# Patient Record
Sex: Female | Born: 1955 | Race: White | Hispanic: No | Smoking: Never smoker
Health system: Southern US, Community
[De-identification: ages and names within clinical notes are randomized; demographics above are authoritative.]

## PROBLEM LIST (undated history)

## (undated) DIAGNOSIS — I2510 Atherosclerotic heart disease of native coronary artery without angina pectoris: Secondary | ICD-10-CM

## (undated) DIAGNOSIS — I4891 Unspecified atrial fibrillation: Secondary | ICD-10-CM

## (undated) DIAGNOSIS — I219 Acute myocardial infarction, unspecified: Secondary | ICD-10-CM

## (undated) DIAGNOSIS — C801 Malignant (primary) neoplasm, unspecified: Secondary | ICD-10-CM

## (undated) HISTORY — PX: JOINT REPLACEMENT: SHX530

## (undated) HISTORY — PX: ABDOMINAL HYSTERECTOMY: SHX81

## (undated) HISTORY — PX: BACK SURGERY: SHX140

---

## 2016-07-12 ENCOUNTER — Emergency Department (HOSPITAL_COMMUNITY)
Admission: EM | Admit: 2016-07-12 | Discharge: 2016-07-12 | Disposition: A | Discharge status: Home / Self Care | Attending: Emergency Medicine | Admitting: Emergency Medicine

## 2016-07-12 ENCOUNTER — Emergency Department (HOSPITAL_COMMUNITY)

## 2016-07-12 ENCOUNTER — Encounter (HOSPITAL_COMMUNITY): Payer: Self-pay | Admitting: Nurse Practitioner

## 2016-07-12 DIAGNOSIS — R599 Enlarged lymph nodes, unspecified: Secondary | ICD-10-CM

## 2016-07-12 DIAGNOSIS — R220 Localized swelling, mass and lump, head: Secondary | ICD-10-CM | POA: Diagnosis present

## 2016-07-12 DIAGNOSIS — I252 Old myocardial infarction: Secondary | ICD-10-CM | POA: Diagnosis not present

## 2016-07-12 DIAGNOSIS — Z859 Personal history of malignant neoplasm, unspecified: Secondary | ICD-10-CM | POA: Diagnosis not present

## 2016-07-12 DIAGNOSIS — Z96698 Presence of other orthopedic joint implants: Secondary | ICD-10-CM | POA: Insufficient documentation

## 2016-07-12 DIAGNOSIS — I2510 Atherosclerotic heart disease of native coronary artery without angina pectoris: Secondary | ICD-10-CM | POA: Insufficient documentation

## 2016-07-12 HISTORY — DX: Atherosclerotic heart disease of native coronary artery without angina pectoris: I25.10

## 2016-07-12 HISTORY — DX: Acute myocardial infarction, unspecified: I21.9

## 2016-07-12 HISTORY — DX: Malignant (primary) neoplasm, unspecified: C80.1

## 2016-07-12 HISTORY — DX: Unspecified atrial fibrillation: I48.91

## 2016-07-12 LAB — CBC WITH DIFFERENTIAL/PLATELET
Basophils Absolute: 0 10*3/uL (ref 0.0–0.1)
Basophils Relative: 0 %
Eosinophils Absolute: 0 10*3/uL (ref 0.0–0.7)
Eosinophils Relative: 0 %
HEMATOCRIT: 37.9 % (ref 36.0–46.0)
HEMOGLOBIN: 12.4 g/dL (ref 12.0–15.0)
Lymphocytes Relative: 25 %
Lymphs Abs: 1.7 10*3/uL (ref 0.7–4.0)
MCH: 29.2 pg (ref 26.0–34.0)
MCHC: 32.7 g/dL (ref 30.0–36.0)
MCV: 89.4 fL (ref 78.0–100.0)
MONOS PCT: 13 %
Monocytes Absolute: 0.9 10*3/uL (ref 0.1–1.0)
NEUTROS ABS: 4.2 10*3/uL (ref 1.7–7.7)
NEUTROS PCT: 62 %
Platelets: 287 10*3/uL (ref 150–400)
RBC: 4.24 MIL/uL (ref 3.87–5.11)
RDW: 13.8 % (ref 11.5–15.5)
WBC: 6.7 10*3/uL (ref 4.0–10.5)

## 2016-07-12 LAB — I-STAT CHEM 8, ED
BUN: 11 mg/dL (ref 6–20)
CREATININE: 1.2 mg/dL — AB (ref 0.44–1.00)
Calcium, Ion: 1.1 mmol/L — ABNORMAL LOW (ref 1.15–1.40)
Chloride: 102 mmol/L (ref 101–111)
Glucose, Bld: 103 mg/dL — ABNORMAL HIGH (ref 65–99)
HEMATOCRIT: 39 % (ref 36.0–46.0)
Hemoglobin: 13.3 g/dL (ref 12.0–15.0)
Potassium: 3.7 mmol/L (ref 3.5–5.1)
Sodium: 134 mmol/L — ABNORMAL LOW (ref 135–145)
TCO2: 19 mmol/L (ref 0–100)

## 2016-07-12 MED ORDER — PREDNISONE 20 MG PO TABS: 40.0000 mg | ORAL_TABLET | Freq: Every day | ORAL | 0 refills | Status: AC

## 2016-07-12 MED ORDER — DEXAMETHASONE SODIUM PHOSPHATE 10 MG/ML IJ SOLN
10.0000 mg | Freq: Once | INTRAMUSCULAR | Status: AC
  Administered 2016-07-12: 10 mg via INTRAVENOUS
  Filled 2016-07-12: qty 1

## 2016-07-12 MED ORDER — CLINDAMYCIN PHOSPHATE 900 MG/50ML IV SOLN
900.0000 mg | Freq: Once | INTRAVENOUS | Status: AC
  Administered 2016-07-12: 900 mg via INTRAVENOUS
  Filled 2016-07-12: qty 50

## 2016-07-12 MED ORDER — IOPAMIDOL (ISOVUE-300) INJECTION 61%
INTRAVENOUS | Status: AC
  Administered 2016-07-12: 75 mL
  Filled 2016-07-12: qty 75

## 2016-07-12 NOTE — ED Notes (Signed)
Patient transported to X-ray 

## 2016-07-12 NOTE — Discharge Instructions (Signed)
Please contact your Dentist to see if they have any recommendations for an oral surgeon as well.    Please also contact the providers below.    It is important that you be seen ASAP for evaluation of your swollen lymph nodes.  Your CT scan results are listed below.  Bring these with you when you get your appointment.  CLINICAL DATA:  Persistent LEFT jaw neck pain after root canal 3 days ago. Dysphagia. History of myocardial infarction. Assess for Ludwig's angina.  EXAM: CT NECK WITH CONTRAST  TECHNIQUE: Multidetector CT imaging of the neck was performed using the standard protocol following the bolus administration of intravenous contrast.  CONTRAST:  71mL ISOVUE-300 IOPAMIDOL (ISOVUE-300) INJECTION 61%  COMPARISON:  None.  FINDINGS: Pharynx and larynx: Normal.  Widely patent airway.  Salivary glands: Normal.  Thyroid: Normal.  Lymph nodes: Greater than expected number rounded LEFT level IIb, 3 and 4 lymph nodes measuring up to 9 mm. 10 mm short access RIGHT level IIa lymph node.  Vascular: Mild calcific atherosclerosis of the aortic arch and LEFT carotid bifurcation.  Limited intracranial: Normal.  Visualized orbits: Normal.  Mastoids and visualized paranasal sinuses: Well-aerated.  Skeleton: No acute osseous process. Moderate C5-6 and C6-7 degenerative discs resulting and moderate RIGHT C5-6 neural foraminal narrowing. Multiple dental fillings without dental caries or periapical lucency/abscess.  Upper chest: Lung apices are clear. No superior mediastinal lymphadenopathy.  Other:  None.  IMPRESSION: Greater than expected number of non pathologically enlarged predominantly LEFT neck lymph nodes with rounded, atypical morphology. Though these may be reactive, metastatic disease versus lymphoproliferative disorder not excluded. Recommend follow-up (short-term imaging follow-up, PET- CT or histopathologic sampling).  Otherwise negative CT neck, no imaged  findings of Ludwig's angina.

## 2016-07-12 NOTE — ED Triage Notes (Signed)
PT reports swelling to tongue swelling that started after she started taking antibiotics and a mouth wash called Dukes . Pt reports her neck,tongue and throat have swollen up. Pt reports she can hard eat.

## 2016-07-12 NOTE — ED Provider Notes (Signed)
[facility] DEPT Provider Note   CSN: [id number] Arrival date & time: 07/12/16  1541     History   Chief Complaint Chief Complaint  Patient presents with  . Mouth Lesions    HPI Stephanie Downs is a 61 y.o. female.  Patient presents to the emergency department with chief complaint of tongue swelling and recent dental abscess. She states that she was seen last week by her dentist, and was told that she needed root canal. She subsequently went to see an endodontist, who performed a root canal, but said that she had an infection underneath the tooth needed to see an oral Psychologist, sport and exercise. She has the appointment scheduled for tomorrow to see oral surgery, but states that yesterday she began to notice swelling along the side of her tongue and beneath her tongue. She states that this is painful, and makes it hard for her to swallow. She denies any shortness of breath. She states that she has had some chills, but denies any fever, nausea, or vomiting. There are no other associated symptoms. The symptoms are worsened with chewing, swallowing, and palpation.   The history is provided by the patient. No language interpreter was used.    Past Medical History:  Diagnosis Date  . Atrial fibrillation ([place])   . Cancer ([place])   . Coronary artery disease   . Myocardial infarction     There are no active problems to display for this patient.   Past Surgical History:  Procedure Laterality Date  . ABDOMINAL HYSTERECTOMY    . BACK SURGERY    . CESAREAN SECTION    . JOINT REPLACEMENT      OB History    No data available       Home Medications    Prior to Admission medications   Not on File    Family History History reviewed. No pertinent family history.  Social History Social History  Substance Use Topics  . Smoking status: Never Smoker  . Smokeless tobacco: Never Used  . Alcohol use No     Allergies   Adhesive [tape]; Aldomet [methyldopa]; Compazine [prochlorperazine  edisylate]; and Sulfa antibiotics   Review of Systems Review of Systems  All other systems reviewed and are negative.    Physical Exam Updated Vital Signs BP 115/63 (BP Location: Left Arm)   Pulse 68   Temp 98.2 F (36.8 C) (Oral)   Resp 18   SpO2 100%   Physical Exam Physical Exam  Constitutional: Pt appears well-developed and well-nourished.  HENT:  Head: Normocephalic.  Right Ear: Tympanic membrane, external ear and ear canal normal.  Left Ear: Tympanic membrane, external ear and ear canal normal.  Nose: Nose normal. Right sinus exhibits no maxillary sinus tenderness and no frontal sinus tenderness. Left sinus exhibits no maxillary sinus tenderness and no frontal sinus tenderness.  Mouth/Throat: Patient has tenderness to palpation of the sublingual space, with mild to moderate swelling, raising the tongue, the tongue is mildly swollen, there is also tenderness palpation along the submandibular and anterior neck, concerning for early Ludwig angina  Eyes: Conjunctivae are normal. Pupils are equal, round, and reactive to light. Right eye exhibits no discharge. Left eye exhibits no discharge.  Neck: Normal range of motion. Neck supple.  No stridor Handling secretions without difficulty No nuchal rigidity No cervical lymphadenopathy Cardiovascular: Normal rate, regular rhythm and normal heart sounds.   Pulmonary/Chest: Effort normal. No respiratory distress.  Equal chest rise  Abdominal: Soft. Bowel sounds are normal. Pt exhibits no distension.  There is no tenderness.  Lymphadenopathy: Pt has no cervical adenopathy.  Neurological: Pt is alert and oriented x 4  Skin: Skin is warm and dry.  Psychiatric: Pt has a normal mood and affect.  Nursing note and vitals reviewed.    ED Treatments / Results  Labs (all labs ordered are listed, but only abnormal results are displayed) Labs Reviewed  I-STAT CHEM 8, ED - Abnormal; Notable for the following:       Result Value    Sodium 134 (*)    Creatinine, Ser 1.20 (*)    Glucose, Bld 103 (*)    Calcium, Ion 1.10 (*)    All other components within normal limits  CBC WITH DIFFERENTIAL/PLATELET    EKG  EKG Interpretation None       Radiology Dg Chest 2 View  Result Date: 07/12/2016 CLINICAL DATA:  Tongue swelling following medication, initial encounter EXAM: CHEST  2 VIEW COMPARISON:  None. FINDINGS: Cardiac shadow is within normal limits. The lungs are well aerated bilaterally. Mild left lingular atelectasis is seen. No sizable effusion is noted. No bony abnormality is noted. IMPRESSION: Lingular atelectasis. Electronically Signed   By: Inez Catalina M.D.   On: 07/12/2016 19:51   Ct Soft Tissue Neck W Contrast  Result Date: 07/12/2016 CLINICAL DATA:  Persistent LEFT jaw neck pain after root canal 3 days ago. Dysphagia. History of myocardial infarction. Assess for Ludwig's angina. EXAM: CT NECK WITH CONTRAST TECHNIQUE: Multidetector CT imaging of the neck was performed using the standard protocol following the bolus administration of intravenous contrast. CONTRAST:  39mL ISOVUE-300 IOPAMIDOL (ISOVUE-300) INJECTION 61% COMPARISON:  None. FINDINGS: Pharynx and larynx: Normal.  Widely patent airway. Salivary glands: Normal. Thyroid: Normal. Lymph nodes: Greater than expected number rounded LEFT level IIb, 3 and 4 lymph nodes measuring up to 9 mm. 10 mm short access RIGHT level IIa lymph node. Vascular: Mild calcific atherosclerosis of the aortic arch and LEFT carotid bifurcation. Limited intracranial: Normal. Visualized orbits: Normal. Mastoids and visualized paranasal sinuses: Well-aerated. Skeleton: No acute osseous process. Moderate C5-6 and C6-7 degenerative discs resulting and moderate RIGHT C5-6 neural foraminal narrowing. Multiple dental fillings without dental caries or periapical lucency/abscess. Upper chest: Lung apices are clear. No superior mediastinal lymphadenopathy. Other:  None. IMPRESSION: Greater than  expected number of non pathologically enlarged predominantly LEFT neck lymph nodes with rounded, atypical morphology. Though these may be reactive, metastatic disease versus lymphoproliferative disorder not excluded. Recommend follow-up (short-term imaging follow-up, PET- CT or histopathologic sampling). Otherwise negative CT neck, no imaged findings of Ludwig's angina. Electronically Signed   By: Elon Alas M.D.   On: 07/12/2016 18:32    Procedures Procedures (including critical care time)  Medications Ordered in ED Medications  clindamycin (CLEOCIN) IVPB 900 mg (900 mg Intravenous New Bag/Given 07/12/16 1707)  dexamethasone (DECADRON) injection 10 mg (10 mg Intravenous Given 07/12/16 1706)     Initial Impression / Assessment and Plan / ED Course  I have reviewed the triage vital signs and the nursing notes.  Pertinent labs & imaging results that were available during my care of the patient were reviewed by me and considered in my medical decision making (see chart for details).    Patient with exam concerning for early [place] angina. Will give clindamycin, Decadron, and get CT of neck.  CT scan shows no evidence of Ludwig angina or deep space infection. There are several enlarged lymph nodes. Radiology recommends short-term follow-up with PET scan or biopsy. Patient's mother had lymphoma.  She is concerned about this. I will check chest x-ray in the ED, but plan for close outpatient follow-up with primary care and with oral surgery. I have asked that the patient discontinue her mouthwash, but continue the antibiotic and steroid medication. Return precautions given. Patient understands and agrees with the plan. She is stable and ready for discharge.  Patient seen by and discussed with Dr. Roderic Palau, who agrees with the plan.  Final Clinical Impressions(s) / ED Diagnoses   Final diagnoses:  Lymph nodes enlarged    New Prescriptions Discharge Medication List as of 07/12/2016  8:13 PM      START taking these medications   Details  predniSONE (DELTASONE) 20 MG tablet Take 2 tablets (40 mg total) by mouth daily., Starting Sun 07/12/2016, Print         Montine Circle, PA-C 07/12/16 2056    Milton Ferguson, MD 07/13/16 1226

## 2016-07-12 NOTE — ED Triage Notes (Signed)
Pt presents with c/o oral pain and swelling. She has had several dental procedures over the past several weeks and most recently was started on oral antibiotics, triamcinolone cream, and dukes mouthwash for an ulcer in her mouth. She was referred to oral surgeon for further evaluation but they cant see her until next week and her symptoms have gotten worse since starting the new medications.

## 2016-08-15 ENCOUNTER — Emergency Department (HOSPITAL_COMMUNITY)
Admission: EM | Admit: 2016-08-15 | Discharge: 2016-08-15 | Disposition: A | Discharge status: Home / Self Care | Attending: Emergency Medicine | Admitting: Emergency Medicine

## 2016-08-15 ENCOUNTER — Encounter (HOSPITAL_COMMUNITY): Payer: Self-pay | Admitting: Nurse Practitioner

## 2016-08-15 ENCOUNTER — Emergency Department (HOSPITAL_COMMUNITY)

## 2016-08-15 DIAGNOSIS — Z79899 Other long term (current) drug therapy: Secondary | ICD-10-CM | POA: Diagnosis not present

## 2016-08-15 DIAGNOSIS — I252 Old myocardial infarction: Secondary | ICD-10-CM | POA: Insufficient documentation

## 2016-08-15 DIAGNOSIS — I2510 Atherosclerotic heart disease of native coronary artery without angina pectoris: Secondary | ICD-10-CM | POA: Diagnosis not present

## 2016-08-15 DIAGNOSIS — K047 Periapical abscess without sinus: Secondary | ICD-10-CM | POA: Diagnosis not present

## 2016-08-15 DIAGNOSIS — K0889 Other specified disorders of teeth and supporting structures: Secondary | ICD-10-CM | POA: Diagnosis present

## 2016-08-15 LAB — BASIC METABOLIC PANEL
Anion gap: 9 (ref 5–15)
BUN: 11 mg/dL (ref 6–20)
CALCIUM: 9.4 mg/dL (ref 8.9–10.3)
CO2: 24 mmol/L (ref 22–32)
Chloride: 110 mmol/L (ref 101–111)
Creatinine, Ser: 1.2 mg/dL — ABNORMAL HIGH (ref 0.44–1.00)
GFR calc Af Amer: 55 mL/min — ABNORMAL LOW (ref >60)
GFR, EST NON AFRICAN AMERICAN: 48 mL/min — AB (ref >60)
GLUCOSE: 96 mg/dL (ref 65–99)
Potassium: 3.8 mmol/L (ref 3.5–5.1)
Sodium: 143 mmol/L (ref 135–145)

## 2016-08-15 LAB — CBC WITH DIFFERENTIAL/PLATELET
BASOS ABS: 0 10*3/uL (ref 0.0–0.1)
Basophils Relative: 0 %
EOS PCT: 1 %
Eosinophils Absolute: 0.1 10*3/uL (ref 0.0–0.7)
HCT: 39.2 % (ref 36.0–46.0)
Hemoglobin: 12.4 g/dL (ref 12.0–15.0)
LYMPHS ABS: 2 10*3/uL (ref 0.7–4.0)
LYMPHS PCT: 40 %
MCH: 29 pg (ref 26.0–34.0)
MCHC: 31.6 g/dL (ref 30.0–36.0)
MCV: 91.6 fL (ref 78.0–100.0)
MONO ABS: 0.5 10*3/uL (ref 0.1–1.0)
Monocytes Relative: 9 %
Neutro Abs: 2.4 10*3/uL (ref 1.7–7.7)
Neutrophils Relative %: 50 %
PLATELETS: 300 10*3/uL (ref 150–400)
RBC: 4.28 MIL/uL (ref 3.87–5.11)
RDW: 15.3 % (ref 11.5–15.5)
WBC: 4.9 10*3/uL (ref 4.0–10.5)

## 2016-08-15 LAB — I-STAT TROPONIN, ED: Troponin i, poc: 0 ng/mL (ref 0.00–0.08)

## 2016-08-15 MED ORDER — IOPAMIDOL (ISOVUE-300) INJECTION 61%
INTRAVENOUS | Status: AC
  Administered 2016-08-15: 75 mL
  Filled 2016-08-15: qty 75

## 2016-08-15 NOTE — ED Triage Notes (Signed)
Pt presents with c/o dental, neck pain. She has been seen here for this in February and since then has been to oral surgeon, endodontist, ENT, PCP, dentist and has taken multiple antibiotics with worsening of her symptoms. Now she has developed a cough and visible swelling to her neck, chest. She returned to ED today for further evaluation. She is currently on clindamycin

## 2016-08-15 NOTE — ED Provider Notes (Signed)
[facility] DEPT Provider Note   CSN: [id number] Arrival date & time: 08/15/16  1300     History   Chief Complaint Chief Complaint  Patient presents with  . Dental Problem    HPI Stephanie Downs is a 61 y.o. female p/w neck tightness/pressure. Pt recently underwent left mandible root canal 2/9 complicated by sublingual infection. Pt has followed-up with ENT and oral surgeon and been on multiple courses of different antibiotics over the past several weeks. She now has sensation of pressure and tightness in left neck and shoulder that feels like her "clavicle might pop."   The history is provided by the patient and medical records.  Illness  This is a new problem. Episode onset: about one week ago. The problem occurs constantly. The problem has been gradually worsening. Pertinent negatives include no chest pain, no abdominal pain, no headaches and no shortness of breath. Nothing aggravates the symptoms. She has tried rest for the symptoms.    Past Medical History:  Diagnosis Date  . Atrial fibrillation ([place])   . Cancer ([place])   . Coronary artery disease   . Myocardial infarction     There are no active problems to display for this patient.   Past Surgical History:  Procedure Laterality Date  . ABDOMINAL HYSTERECTOMY    . BACK SURGERY    . CESAREAN SECTION    . JOINT REPLACEMENT      OB History    No data available       Home Medications    Prior to Admission medications   Medication Sig Start Date End Date Taking? Authorizing Provider  amoxicillin (AMOXIL) 875 MG tablet Take 875 mg by mouth 2 (two) times daily. 7 day course filled 07/10/16 07/10/16   Historical Provider, MD  atorvastatin (LIPITOR) 40 MG tablet Take 40 mg by mouth at bedtime.    Historical Provider, MD  cholecalciferol (VITAMIN D) 1000 units tablet Take 1,000 Units by mouth daily.    Historical Provider, MD  diclofenac sodium (VOLTAREN) 1 % GEL Apply 2 g topically at bedtime. Knee pain     Historical Provider, MD  diltiazem (CARDIZEM CD) 180 MG 24 hr capsule Take 180 mg by mouth daily.    Historical Provider, MD  Diphenhyd-Hydrocort-Nystatin (FIRST-DUKES MOUTHWASH) SUSP Use as directed 5 mLs in the mouth or throat every 4 (four) hours as needed (mouth pain (swish and spit)).    Historical Provider, MD  donepezil (ARICEPT) 10 MG tablet Take 10 mg by mouth at bedtime.    Historical Provider, MD  fluticasone (FLONASE) 50 MCG/ACT nasal spray Place 1 spray into both nostrils daily as needed for allergies or rhinitis (congestion).    Historical Provider, MD  furosemide (LASIX) 20 MG tablet Take 20 mg by mouth every evening.    Historical Provider, MD  furosemide (LASIX) 40 MG tablet Take 40 mg by mouth daily.    Historical Provider, MD  gabapentin (NEURONTIN) 300 MG capsule Take 60 mg by mouth 2 (two) times daily.    Historical Provider, MD  hydrochlorothiazide (HYDRODIURIL) 25 MG tablet Take 25 mg by mouth daily.    Historical Provider, MD  isosorbide mononitrate (IMDUR) 60 MG 24 hr tablet Take 60 mg by mouth at bedtime.    Historical Provider, MD  ketotifen (ZADITOR) 0.025 % ophthalmic solution Place 1 drop into both eyes daily.    Historical Provider, MD  lisinopril (PRINIVIL,ZESTRIL) 40 MG tablet Take 40 mg by mouth daily.    Historical Provider, MD  memantine (NAMENDA) 10 MG tablet Take 10 mg by mouth 2 (two) times daily.    Historical Provider, MD  metoprolol succinate (TOPROL-XL) 50 MG 24 hr tablet Take 25 mg by mouth every evening. Take with or immediately following a meal.    Historical Provider, MD  omeprazole (PRILOSEC) 20 MG capsule Take 20 mg by mouth daily.    Historical Provider, MD  potassium chloride SA (K-DUR,KLOR-CON) 20 MEQ tablet Take 60 mEq by mouth daily.    Historical Provider, MD  predniSONE (DELTASONE) 20 MG tablet Take 2 tablets (40 mg total) by mouth daily. 07/12/16   Montine Circle, PA-C  rivaroxaban (XARELTO) 20 MG TABS tablet Take 20 mg by mouth every evening.     Historical Provider, MD  topiramate (TOPAMAX) 25 MG tablet Take 50 mg by mouth 2 (two) times daily.    Historical Provider, MD    Family History History reviewed. No pertinent family history.  Social History Social History  Substance Use Topics  . Smoking status: Never Smoker  . Smokeless tobacco: Never Used  . Alcohol use No     Allergies   Sulfa antibiotics; Adhesive [tape]; Aldomet [methyldopa]; and Compazine [prochlorperazine edisylate]   Review of Systems Review of Systems  Constitutional: Negative for fever.  HENT: Negative for facial swelling.   Respiratory: Positive for cough. Negative for shortness of breath.   Cardiovascular: Negative for chest pain, palpitations and leg swelling.  Gastrointestinal: Negative for abdominal pain, nausea and vomiting.  Musculoskeletal: Positive for neck pain.  Skin: Negative for wound.  Allergic/Immunologic: Negative for immunocompromised state.  Neurological: Negative for headaches.  All other systems reviewed and are negative.    Physical Exam Updated Vital Signs BP 123/75   Pulse 66   Temp 97.4 F (36.3 C) (Oral)   Resp 17   SpO2 99%   Physical Exam  Constitutional: She is oriented to person, place, and time. She appears well-developed and well-nourished. No distress.  HENT:  Head: Normocephalic and atraumatic.  Mouth/Throat: Uvula is midline, oropharynx is clear and moist and mucous membranes are normal.  Mild sublingual swelling, soft,  L>R. No elevation of tongue  Eyes: Conjunctivae are normal.  Neck: Normal range of motion. Neck supple. No tracheal deviation present.  No appreciable swelling or LN   Cardiovascular: Normal rate and regular rhythm.   No murmur heard. Pulmonary/Chest: Effort normal and breath sounds normal. No respiratory distress.  Abdominal: Soft. There is no tenderness.  Musculoskeletal: Normal range of motion. She exhibits no edema.  Lymphadenopathy:    She has no cervical adenopathy.    Neurological: She is alert and oriented to person, place, and time.  Skin: Skin is warm and dry. Capillary refill takes less than 2 seconds.  Psychiatric: She has a normal mood and affect.  Nursing note and vitals reviewed.    ED Treatments / Results  Labs (all labs ordered are listed, but only abnormal results are displayed) Labs Reviewed  BASIC METABOLIC PANEL - Abnormal; Notable for the following:       Result Value   Creatinine, Ser 1.20 (*)    GFR calc non Af Amer 48 (*)    GFR calc Af Amer 55 (*)    All other components within normal limits  CBC WITH DIFFERENTIAL/PLATELET  Randolm Idol, ED    EKG  EKG Interpretation None       Radiology Ct Soft Tissue Neck W Contrast  Result Date: 08/15/2016 CLINICAL DATA:  Increasing left-sided neck pain for 1  month. Recent sublingual infection. EXAM: CT NECK WITH CONTRAST TECHNIQUE: Multidetector CT imaging of the neck was performed using the standard protocol following the bolus administration of intravenous contrast. CONTRAST:  12mL ISOVUE-300 IOPAMIDOL (ISOVUE-300) INJECTION 61% COMPARISON:  07/12/2016 FINDINGS: Pharynx and larynx: No evidence of pharyngeal mass or parapharyngeal inflammatory change. Unremarkable larynx. Salivary glands: No inflammation, mass, or stone. Thyroid: Unremarkable. Lymph nodes: Prominent lymph nodes throughout the left neck on the prior CT have all decreased in size. The largest residual node measures 6 mm in short axis in level II (previously 10 mm). A right level II/III lymph node has also decreased in size, now measuring 8 mm (previously 10 mm). No new enlarged or suspicious lymph nodes are identified. Vascular: Major vascular structures of the neck appear patent. Mild calcified plaque in the proximal left ICA without stenosis. Limited intracranial: Unremarkable. Visualized orbits: Unremarkable. Mastoids and visualized paranasal sinuses: Clear. Skeleton: Moderate lower cervical disc degeneration. Upper  cervical facet arthrosis, moderate on the left at C2-3 and C3-4. Bilateral C2-3 facet ankylosis. No suspicious osseous lesion. Upper chest: Mild motion artifact and subsegmental atelectasis in the upper lobes and superior segments of the lower lobes bilaterally. Other: None. IMPRESSION: 1. Decreased size of lymph nodes throughout the left neck, compatible with a reactive etiology. 2. No evidence of new or acute abnormality in the neck. Electronically Signed   By: Logan Bores M.D.   On: 08/15/2016 18:41    Procedures Procedures (including critical care time)  Medications Ordered in ED Medications  iopamidol (ISOVUE-300) 61 % injection (75 mLs  Contrast Given 08/15/16 1800)     Initial Impression / Assessment and Plan / ED Course  I have reviewed the triage vital signs and the nursing notes.  Pertinent labs & imaging results that were available during my care of the patient were reviewed by me and considered in my medical decision making (see chart for details).    61 y.o. female presents for subjective neck swelling and pressure in setting of recent sublingual infection 2/2 root canal. VSS, NAD. Physical exam unremarkable. No overlying cellulitis or appreciable swelling. Concern that infection may have spread, will order CT neck to further evaluate.  - BMP, CBC, Trop unremarkable - CT shows decrease in size of reactive lymph nodes compared to previous study, no evidence of further abnormalities. No evidence of worsening infection.  Advised to follow-up with oral surgeon and ENT. Return precautions given. Pt voiced understanding and agreement with plan.   Discussed with my attending physician, Dr Reather Converse  Final Clinical Impressions(s) / ED Diagnoses   Final diagnoses:  Dental infection    New Prescriptions Discharge Medication List as of 08/15/2016  6:57 PM       Monico Blitz, MD 08/16/16 0020    Elnora Morrison, MD 08/16/16 1726

## 2017-02-05 ENCOUNTER — Encounter (HOSPITAL_COMMUNITY): Payer: Self-pay | Admitting: *Deleted

## 2017-02-05 ENCOUNTER — Emergency Department (HOSPITAL_COMMUNITY)
Admission: EM | Admit: 2017-02-05 | Discharge: 2017-02-05 | Disposition: A | Discharge status: Home / Self Care | Attending: Emergency Medicine | Admitting: Emergency Medicine

## 2017-02-05 DIAGNOSIS — R519 Headache, unspecified: Secondary | ICD-10-CM

## 2017-02-05 DIAGNOSIS — Z859 Personal history of malignant neoplasm, unspecified: Secondary | ICD-10-CM | POA: Insufficient documentation

## 2017-02-05 DIAGNOSIS — I252 Old myocardial infarction: Secondary | ICD-10-CM | POA: Insufficient documentation

## 2017-02-05 DIAGNOSIS — K1370 Unspecified lesions of oral mucosa: Secondary | ICD-10-CM | POA: Diagnosis not present

## 2017-02-05 DIAGNOSIS — Z79899 Other long term (current) drug therapy: Secondary | ICD-10-CM | POA: Insufficient documentation

## 2017-02-05 DIAGNOSIS — I2510 Atherosclerotic heart disease of native coronary artery without angina pectoris: Secondary | ICD-10-CM | POA: Insufficient documentation

## 2017-02-05 DIAGNOSIS — R51 Headache: Secondary | ICD-10-CM | POA: Diagnosis not present

## 2017-02-05 DIAGNOSIS — J029 Acute pharyngitis, unspecified: Secondary | ICD-10-CM | POA: Diagnosis present

## 2017-02-05 DIAGNOSIS — Z7901 Long term (current) use of anticoagulants: Secondary | ICD-10-CM | POA: Diagnosis not present

## 2017-02-05 NOTE — ED Provider Notes (Signed)
[facility] DEPT Provider Note   CSN: [id number] Arrival date & time: 02/05/17  1201     History   Chief Complaint Chief Complaint  Patient presents with  . Sore Throat  . Headache    HPI Stephanie Downs is a 61 y.o. female.  HPI Patient has had symptoms for 3 weeks that at onset included ulcers in her mouth and nose. She reports that there was a lot of burning and discomfort at that time. Those have resolved but she has persistent problems with burning and pain in her throat. She reports she feels like someone's hands are around her throat at all times. She also reports that she constantly has pain and stiffness in the back of her neck and headache at the back of her head. She reports that onset there was a "low-grade fever". She has not been having any fevers, chills. She indicates that most of her sense of discomfort and tightness in her throat occurs right in the sternal notch. Patient is scheduled to see ENT in 2 weeks. Patient does report in the course of evaluation of this condition, she had been told that she had thrush and was advised to take medication. She followed up with her family doctor who looked at her and reported she had no findings of thrush and thus she did not have empiric treatment for it. Earlier this year in February the patient had a dental abscess that required root canal. She reports that after all her procedures were completed, all the symptoms resolved. She identifies his symptoms as started 3 weeks ago as being new. Past Medical History:  Diagnosis Date  . Atrial fibrillation ([place])   . Cancer ([place])   . Coronary artery disease   . Myocardial infarction ([place])     There are no active problems to display for this patient.   Past Surgical History:  Procedure Laterality Date  . ABDOMINAL HYSTERECTOMY    . BACK SURGERY    . CESAREAN SECTION    . JOINT REPLACEMENT      OB History    No data available       Home Medications    Prior to  Admission medications   Medication Sig Start Date End Date Taking? Authorizing Provider  amoxicillin (AMOXIL) 875 MG tablet Take 875 mg by mouth 2 (two) times daily. 7 day course filled 07/10/16 07/10/16   [provider]  atorvastatin (LIPITOR) 40 MG tablet Take 40 mg by mouth at bedtime.    [provider]  cholecalciferol (VITAMIN D) 1000 units tablet Take 1,000 Units by mouth daily.    [provider]  diclofenac sodium (VOLTAREN) 1 % GEL Apply 2 g topically at bedtime. Knee pain    [provider]  diltiazem (CARDIZEM CD) 180 MG 24 hr capsule Take 180 mg by mouth daily.    [provider]  Diphenhyd-Hydrocort-Nystatin (FIRST-DUKES MOUTHWASH) SUSP Use as directed 5 mLs in the mouth or throat every 4 (four) hours as needed (mouth pain (swish and spit)).    [provider]  donepezil (ARICEPT) 10 MG tablet Take 10 mg by mouth at bedtime.    [provider]  fluticasone (FLONASE) 50 MCG/ACT nasal spray Place 1 spray into both nostrils daily as needed for allergies or rhinitis (congestion).    [provider]  furosemide (LASIX) 20 MG tablet Take 20 mg by mouth every evening.    [provider]  furosemide (LASIX) 40 MG tablet Take 40 mg by  mouth daily.    [provider]  gabapentin (NEURONTIN) 300 MG capsule Take 60 mg by mouth 2 (two) times daily.    [provider]  hydrochlorothiazide (HYDRODIURIL) 25 MG tablet Take 25 mg by mouth daily.    [provider]  isosorbide mononitrate (IMDUR) 60 MG 24 hr tablet Take 60 mg by mouth at bedtime.    [provider]  ketotifen (ZADITOR) 0.025 % ophthalmic solution Place 1 drop into both eyes daily.    [provider]  lisinopril (PRINIVIL,ZESTRIL) 40 MG tablet Take 40 mg by mouth daily.    [provider]  memantine (NAMENDA) 10 MG tablet Take 10 mg by mouth 2 (two) times daily.    [provider]  metoprolol  succinate (TOPROL-XL) 50 MG 24 hr tablet Take 25 mg by mouth every evening. Take with or immediately following a meal.    [provider]  omeprazole (PRILOSEC) 20 MG capsule Take 20 mg by mouth daily.    [provider]  potassium chloride SA (K-DUR,KLOR-CON) 20 MEQ tablet Take 60 mEq by mouth daily.    [provider]  predniSONE (DELTASONE) 20 MG tablet Take 2 tablets (40 mg total) by mouth daily. 07/12/16   Montine Circle, PA-C  rivaroxaban (XARELTO) 20 MG TABS tablet Take 20 mg by mouth every evening.    [provider]  topiramate (TOPAMAX) 25 MG tablet Take 50 mg by mouth 2 (two) times daily.    [provider]    Family History No family history on file.  Social History Social History  Substance Use Topics  . Smoking status: Never Smoker  . Smokeless tobacco: Never Used  . Alcohol use No     Allergies   Sulfa antibiotics; Adhesive [tape]; Aldomet [methyldopa]; and Compazine [prochlorperazine edisylate]   Review of Systems Review of Systems 10 Systems reviewed and are negative for acute change except as noted in the HPI.  Physical Exam Updated Vital Signs BP (!) 143/62   Pulse (!) 57   Temp 98.2 F (36.8 C) (Oral)   Resp 18   Ht 5\' 8"  (1.727 m)   Wt 104.3 kg (230 lb)   SpO2 100%   BMI 34.97 kg/m   Physical Exam  Constitutional: She is oriented to person, place, and time. She appears well-developed and well-nourished. No distress.  HENT:  Head: Normocephalic and atraumatic.  Bilateral TMs normal. Nares patent with patent nasal canals. No significant turbinate swelling, drainage or discharge. Oral cavity, mucous membranes are pink and moist. Patient has few serpiginous, flat lesions on tongue consistent with geographic tongue. Posterior oropharynx is widely patent. There is no erythema of the tonsillar pillars. There are no lesions on the palate or the posterior pillars. Uvula is midline. No postnasal drainage. Dentition  is in good condition with areas of dental work but no appearance of severe decay. Normal range of motion of the jaw with no trismus.  Eyes: Conjunctivae and EOM are normal.  Neck: Neck supple. No tracheal deviation present. No thyromegaly present.  Neck is normal. No stridor to auscultation. No meningismus. No palpable adenopathy or thyromegaly. Patient expresses discomfort to palpation in the sternal notch and identifies this as the area of concern.  Cardiovascular: Normal rate, regular rhythm and normal heart sounds.   Pulmonary/Chest: Effort normal and breath sounds normal. No stridor.  Musculoskeletal: Normal range of motion.  Patient goes from sitting to standing without difficulty all movements are coordinated purposeful symmetric. She can get  on the stretcher, follow all commands for positioning, dress and undress without difficulty.  Lymphadenopathy:    She has no cervical adenopathy.  Neurological: She is alert and oriented to person, place, and time. No cranial nerve deficit. She exhibits normal muscle tone. Coordination normal.  Skin: Skin is warm and dry.  Psychiatric: She has a normal mood and affect.     ED Treatments / Results  Labs (all labs ordered are listed, but only abnormal results are displayed) Labs Reviewed - No data to display  EKG  EKG Interpretation None       Radiology No results found.  Procedures Procedures (including critical care time)  Medications Ordered in ED Medications - No data to display   Initial Impression / Assessment and Plan / ED Course  I have reviewed the triage vital signs and the nursing notes.  Pertinent labs & imaging results that were available during my care of the patient were reviewed by me and considered in my medical decision making (see chart for details).     Final Clinical Impressions(s) / ED Diagnoses   Final diagnoses:  Pharyngitis, unspecified etiology  Nonintractable headache, unspecified chronicity  pattern, unspecified headache type   After extensive discussion of the patient's history of present illness and complete ENT and neck exam as well as reviewing EMR, I did not feel that further diagnostic imaging was going to be helpful in establishing a diagnosis for the patient today. Clinically ,I could not find any evidence of a compressive or infectious etiology that appeared urgent or emergent. The patient was extremely dissatisfied with my clinical recommendations. She advised that she wanted a another CT scan of the neck to make sure there was nothing wrong. I advised her that as she felt extremely strongly on this, I was willing to accommodate her however, it was in my opinion that this was a bad medical choice due to the fact that she has already had 2 CT angiograms of the neck this year and that due to high radiation exposure and risk of dye administration that I felt risks significantly outweighed likely benefit at this time. I felt that it was very low probability that this test was going to identify the cause of the patient's symptoms. I explained to her I felt that she was in stable condition to await her ENT follow-up and probable endoscopic evaluation which was likely to be more helpful. The patient was extremely angry that she had waited for 4 hours to be told that nothing was going to be done. She advised that if her doctor found ANYTHING wrong with her she would be back to pursue some type of measures against me. I did advise the patient that there was certainly a possibility that there is a cause for her symptoms that may be identified, it was simply a matter of my opinion that no testing that will be done through the emergency department was likely to yield any diagnosis for the condition. The patient removed her gown and left with hostile demeanor. New Prescriptions Discharge Medication List as of 02/05/2017  4:49 PM       Charlesetta Shanks, MD 02/05/17 1710

## 2017-02-05 NOTE — ED Triage Notes (Signed)
Pt c/o new swelling described by pt " it feels like a knot. It started with ulcers in my mouth and throat and a burning headache. I have an appointment with an ENT 02/19/17." pt seen at [facility] for dizziness pt r/t HA this week and was evaluated for a fall, pt ambulatory, airway intact, has control of secretions, speaks in full sentences, A&O x4

## 2018-05-01 IMAGING — CT CT NECK W/ CM
4 series · 16 of 33 positions shown, 19 images · IV contrast (iopamidol)
Comparison: 07/12/2016

CLINICAL DATA: Increasing left-sided neck pain for 1 month. Recent
sublingual infection.

EXAM:
CT NECK WITH CONTRAST
TECHNIQUE: Multidetector CT imaging of the neck was performed using the
standard protocol following the bolus administration of intravenous
contrast.
CONTRAST:  75mL [id number] IOPAMIDOL ([id number]) INJECTION 61%

[Series 201: soft tissue, idose (2) · axial · 0.54mm/px · z∈[+106,+186]mm · 3 of 121 slices shown]
[im 21/121  soft-tissue]
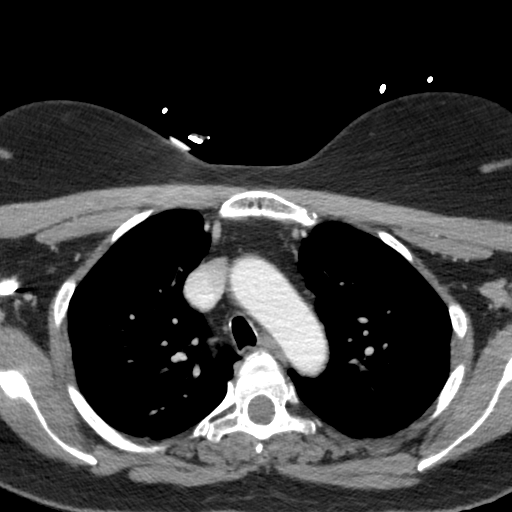
[im 41/121  soft-tissue]
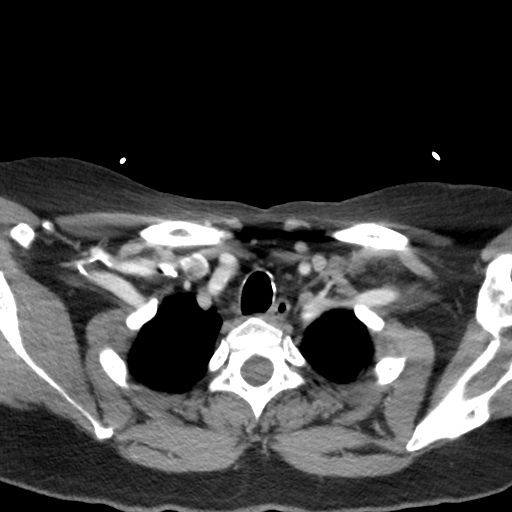
[im 61/121  soft-tissue]
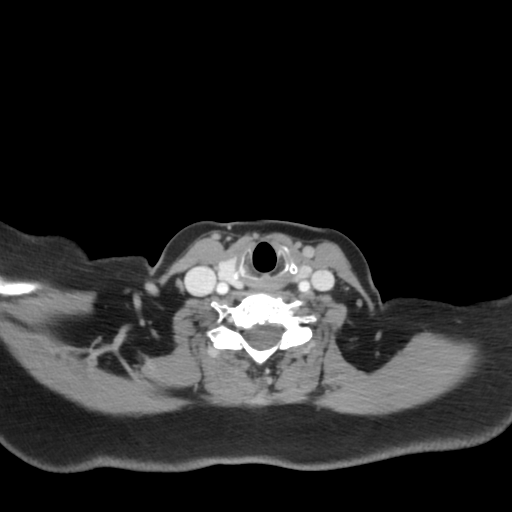

[Series 204: coronal, idose (2) · coronal · 0.46mm/px · 3 of 136 slices shown]
[im 33/136  bone]
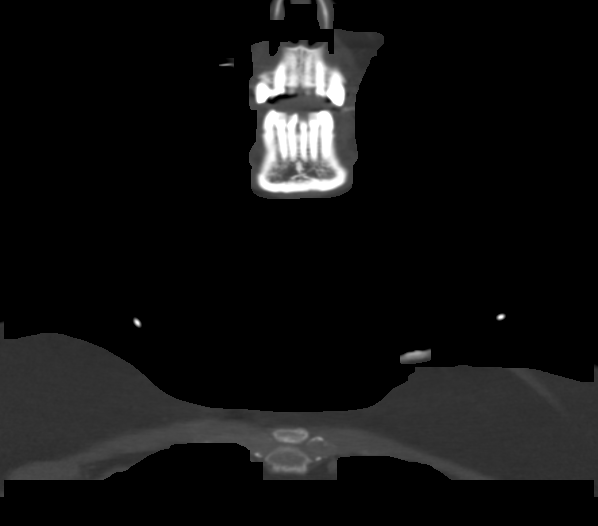
[im 56/136  bone]
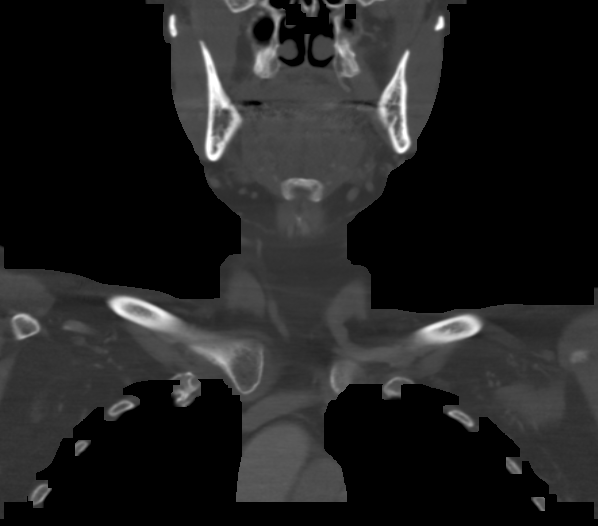
[im 80/136  bone]
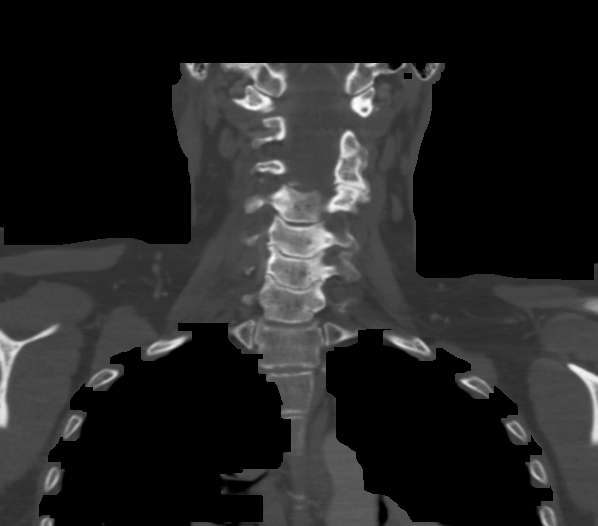

[Series 205: sagittal, idose (2) · sagittal · 0.45mm/px · 5 of 137 slices shown, 6 images]
[im 46/137  bone]
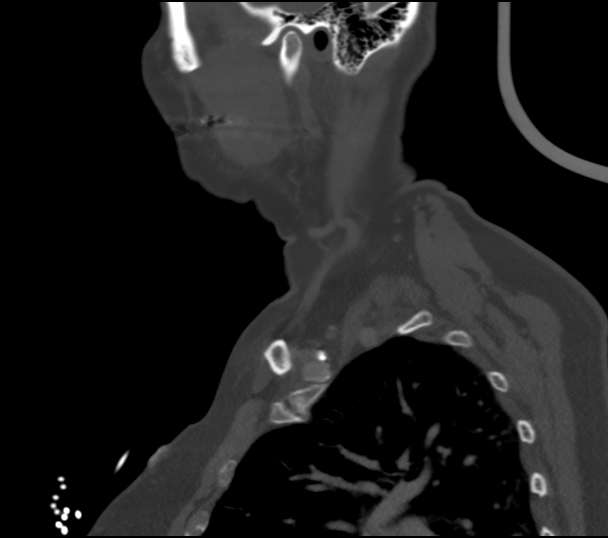
[im 57/137  bone]
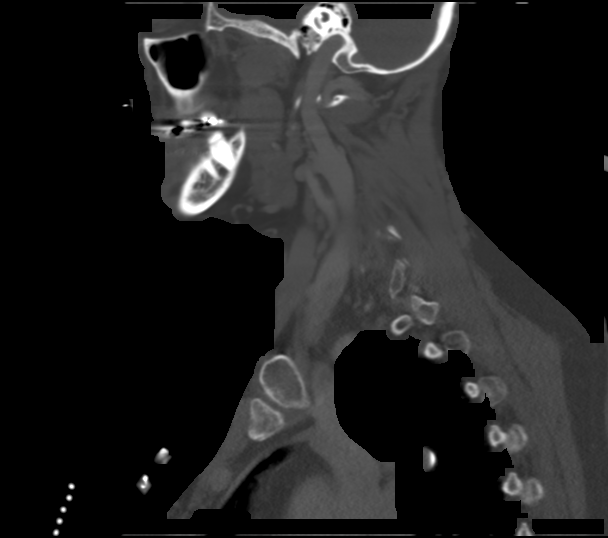
[im 69/137  soft-tissue]
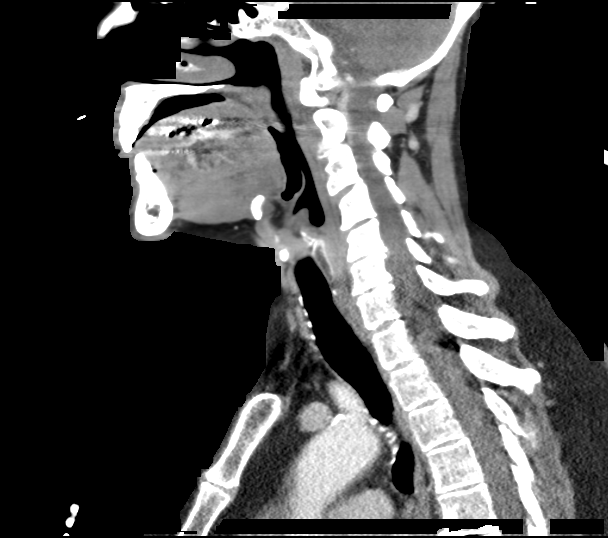
[im 69/137  bone]
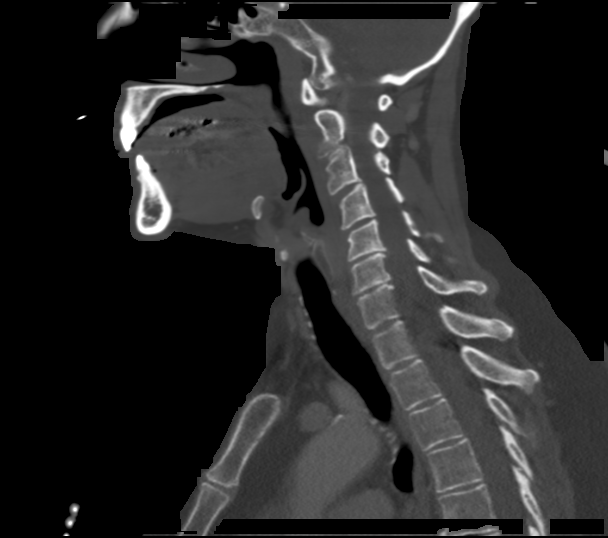
[im 80/137  bone]
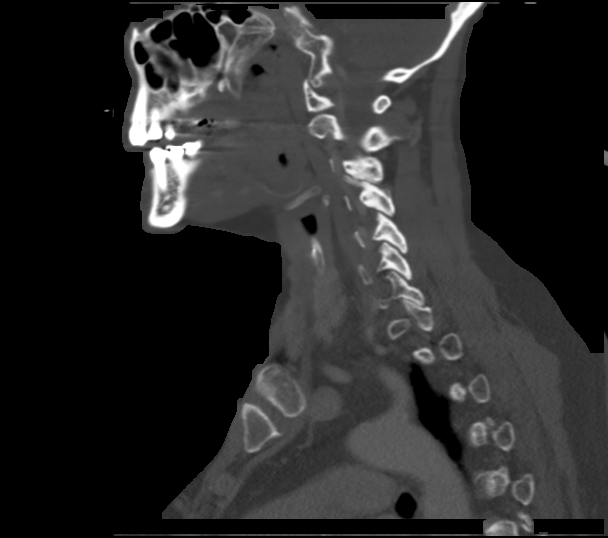
[im 91/137  bone]
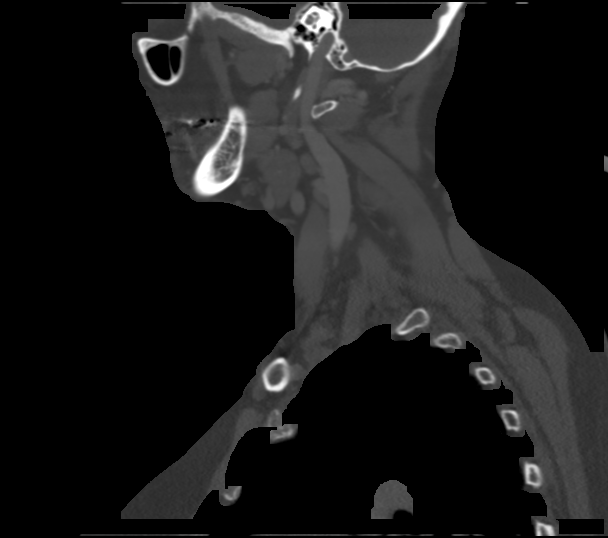

[Series 206: orthogonal, idose (2) · axial · 0.62mm/px · z∈[+79,+235]mm · 5 of 121 slices shown, 7 images]
[im 21/121  soft-tissue]
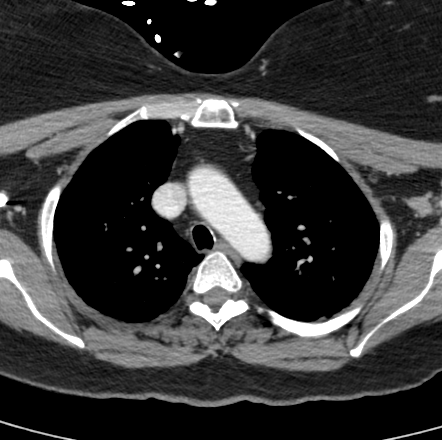
[im 21/121  bone]
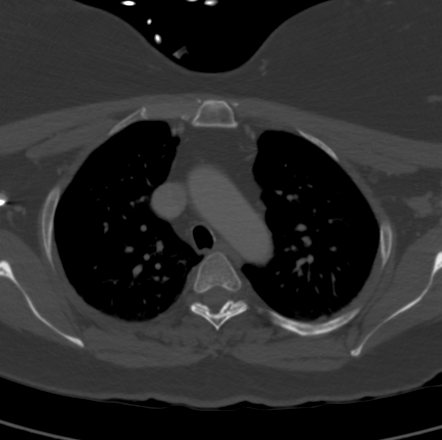
[im 41/121  bone]
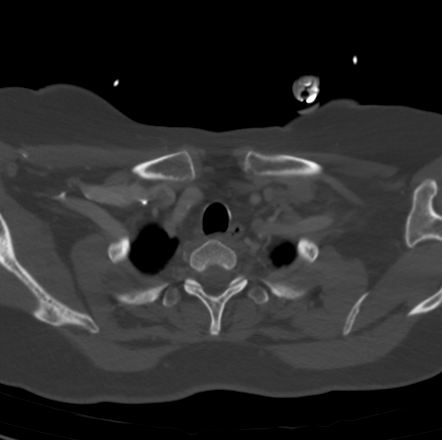
[im 61/121  bone]
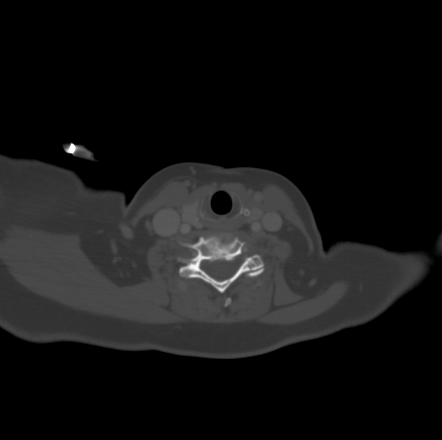
[im 81/121  bone]
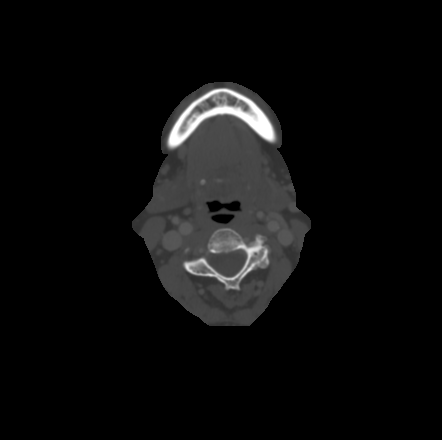
[im 101/121  soft-tissue]
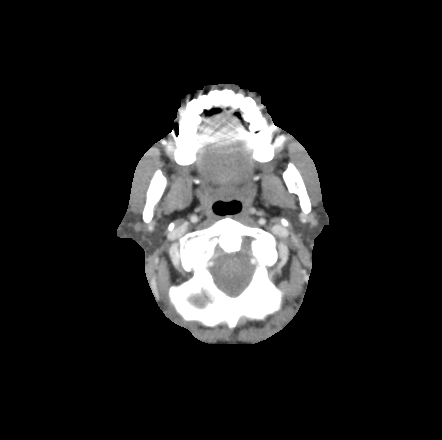
[im 101/121  bone]
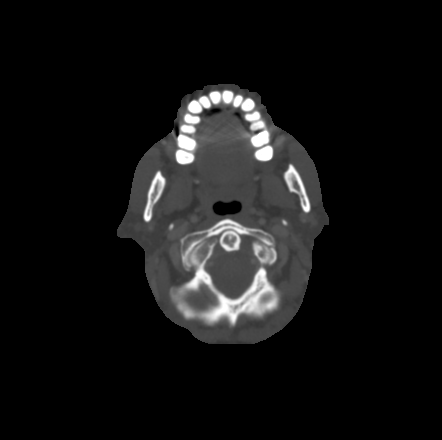

[16 of 33 positions shown; findings below may reference images not displayed]

FINDINGS: Pharynx and larynx: No evidence of pharyngeal mass or parapharyngeal
inflammatory change. Unremarkable larynx.

Salivary glands: No inflammation, mass, or stone.

Thyroid: Unremarkable.

Lymph nodes: Prominent lymph nodes throughout the left neck on the
prior CT have all decreased in size. The largest residual node
measures 6 mm in short axis in level II (previously 10 mm). A right
level II/III lymph node has also decreased in size, now measuring 8
mm (previously 10 mm). No new enlarged or suspicious lymph nodes are
identified.

Vascular: Major vascular structures of the neck appear patent. Mild
calcified plaque in the proximal left ICA without stenosis.

Limited intracranial: Unremarkable.

Visualized orbits: Unremarkable.

Mastoids and visualized paranasal sinuses: Clear.

Skeleton: Moderate lower cervical disc degeneration. Upper cervical
facet arthrosis, moderate on the left at C2-3 and C3-4. Bilateral
C2-3 facet ankylosis. No suspicious osseous lesion.

Upper chest: Mild motion artifact and subsegmental atelectasis in
the upper lobes and superior segments of the lower lobes
bilaterally.

Other: None.
IMPRESSION: 1. Decreased size of lymph nodes throughout the left neck,
compatible with a reactive etiology.
2. No evidence of new or acute abnormality in the neck.
